# Patient Record
Sex: Female | Born: 1995 | Race: Black or African American | Hispanic: No | Marital: Single | Smoking: Never smoker
Health system: Southern US, Community
[De-identification: ages and names within clinical notes are randomized; demographics above are authoritative.]

## PROBLEM LIST (undated history)

## (undated) ENCOUNTER — Ambulatory Visit: Admission: EM | Payer: PPO | Source: Home / Self Care

## (undated) DIAGNOSIS — D649 Anemia, unspecified: Secondary | ICD-10-CM

## (undated) DIAGNOSIS — E559 Vitamin D deficiency, unspecified: Secondary | ICD-10-CM

## (undated) DIAGNOSIS — F419 Anxiety disorder, unspecified: Secondary | ICD-10-CM

## (undated) HISTORY — DX: Anxiety disorder, unspecified: F41.9

## (undated) HISTORY — PX: OVARIAN CYST REMOVAL: SHX89

## (undated) HISTORY — DX: Anemia, unspecified: D64.9

## (undated) HISTORY — DX: Vitamin D deficiency, unspecified: E55.9

---
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
## 2017-01-18 ENCOUNTER — Emergency Department (HOSPITAL_COMMUNITY)
Admission: EM | Admit: 2017-01-18 | Discharge: 2017-01-18 | Disposition: A | Discharge status: Home / Self Care | Payer: Medicaid Other | Attending: Emergency Medicine | Admitting: Emergency Medicine

## 2017-01-18 ENCOUNTER — Emergency Department (HOSPITAL_COMMUNITY): Payer: Medicaid Other

## 2017-01-18 ENCOUNTER — Encounter (HOSPITAL_COMMUNITY): Payer: Self-pay

## 2017-01-18 DIAGNOSIS — Y999 Unspecified external cause status: Secondary | ICD-10-CM | POA: Diagnosis not present

## 2017-01-18 DIAGNOSIS — S161XXA Strain of muscle, fascia and tendon at neck level, initial encounter: Secondary | ICD-10-CM

## 2017-01-18 DIAGNOSIS — Y92410 Unspecified street and highway as the place of occurrence of the external cause: Secondary | ICD-10-CM | POA: Insufficient documentation

## 2017-01-18 DIAGNOSIS — S199XXA Unspecified injury of neck, initial encounter: Secondary | ICD-10-CM | POA: Diagnosis present

## 2017-01-18 DIAGNOSIS — S20212A Contusion of left front wall of thorax, initial encounter: Secondary | ICD-10-CM | POA: Diagnosis not present

## 2017-01-18 DIAGNOSIS — Y9389 Activity, other specified: Secondary | ICD-10-CM | POA: Insufficient documentation

## 2017-01-18 DIAGNOSIS — Z79899 Other long term (current) drug therapy: Secondary | ICD-10-CM | POA: Diagnosis not present

## 2017-01-18 LAB — CBG MONITORING, ED: GLUCOSE-CAPILLARY: 93 mg/dL (ref 65–99)

## 2017-01-18 MED ORDER — ACETAMINOPHEN 325 MG PO TABS
650.0000 mg | ORAL_TABLET | Freq: Once | ORAL | Status: AC
  Administered 2017-01-18: 650 mg via ORAL
  Filled 2017-01-18: qty 2

## 2017-01-18 MED ORDER — TETRACAINE HCL 0.5 % OP SOLN
2.0000 [drp] | Freq: Once | OPHTHALMIC | Status: AC
  Administered 2017-01-18: 2 [drp] via OPHTHALMIC
  Filled 2017-01-18: qty 4

## 2017-01-18 MED ORDER — FLUORESCEIN SODIUM 0.6 MG OP STRP
1.0000 | ORAL_STRIP | Freq: Once | OPHTHALMIC | Status: AC
  Administered 2017-01-18: 1 via OPHTHALMIC
  Filled 2017-01-18: qty 1
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
## 2017-01-18 NOTE — ED Notes (Signed)
Pt states she understands instructions. Home stable with family.States mpain much improved and speaking and moving more easily.

## 2017-01-18 NOTE — ED Notes (Signed)
Patient transported to CT 
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
## 2019-03-16 ENCOUNTER — Ambulatory Visit: Payer: Self-pay | Admitting: Nurse Practitioner

## 2019-03-20 ENCOUNTER — Other Ambulatory Visit (HOSPITAL_COMMUNITY)
Admission: RE | Admit: 2019-03-20 | Discharge: 2019-03-20 | Disposition: A | Discharge status: Home / Self Care | Payer: Medicaid Other | Source: Ambulatory Visit | Attending: Nurse Practitioner | Admitting: Nurse Practitioner

## 2019-03-20 ENCOUNTER — Ambulatory Visit (INDEPENDENT_AMBULATORY_CARE_PROVIDER_SITE_OTHER): Payer: Medicaid Other | Admitting: Advanced Practice Midwife

## 2019-03-20 ENCOUNTER — Other Ambulatory Visit: Payer: Self-pay

## 2019-03-20 ENCOUNTER — Encounter: Payer: Self-pay | Admitting: Advanced Practice Midwife

## 2019-03-20 VITALS — BP 111/72 | HR 77 | Temp 99.1°F | Ht 65.0 in | Wt 133.7 lb

## 2019-03-20 DIAGNOSIS — L700 Acne vulgaris: Secondary | ICD-10-CM

## 2019-03-20 DIAGNOSIS — N898 Other specified noninflammatory disorders of vagina: Secondary | ICD-10-CM | POA: Insufficient documentation

## 2019-03-20 DIAGNOSIS — N939 Abnormal uterine and vaginal bleeding, unspecified: Secondary | ICD-10-CM | POA: Diagnosis not present

## 2019-03-20 MED ORDER — NORETHIN-ETH ESTRAD-FE BIPHAS 1 MG-10 MCG / 10 MCG PO TABS: 1.0000 | ORAL_TABLET | Freq: Every day | ORAL | 11 refills | Status: DC

## 2019-03-20 NOTE — Progress Notes (Signed)
Subjective:    Robin Nichols is a 23 y.o. female who presents for an annual exam. The patient reports a year of light yellow discharge with odor for previous 43months. The patient is not sexually active. GYN screening history: no prior history of gyn screening tests. Patient denies dysuria, hematuria, or itch. Patient does feel that her menstrual bleeding is "a bit excessive". Endorses that she sometimes bleeds for up to two weeks in a month and she would like to talk about OCP's.   Menstrual History: OB History    Gravida  0   Para  0   Term  0   Preterm  0   AB  0   Living  0     SAB  0   TAB  0   Ectopic  0   Multiple  0   Live Births  0           Menarche age: 89 Patient's last menstrual period was 03/01/2019 (exact date).    The following portions of the patient's history were reviewed and updated as appropriate: current medications, past family history, past medical history, past social history, past surgical history and problem list.  Review of Systems Pertinent items noted in HPI and remainder of comprehensive ROS otherwise negative.    Objective:   BP 111/72   Pulse 77   Temp 99.1 F (37.3 C)   Ht 5\' 5"  (1.651 m)   Wt 133 lb 11.2 oz (60.6 kg)   LMP 03/01/2019 (Exact Date)   BMI 22.25 kg/m  CONSTITUTIONAL: Well-developed, well-nourished female in no acute distress.  HENT:  Normocephalic, atraumatic, External right and left ear normal. Oropharynx is clear and moist EYES: Conjunctivae and EOM are normal. Pupils are equal, round, and reactive to light. No scleral icterus.  NECK: Normal range of motion, supple, no masses.  Normal thyroid.  SKIN: Skin is warm and dry. No rash noted. Not diaphoretic. No erythema. No pallor. [place]: Alert and oriented to person, place, and time. Normal reflexes, muscle tone coordination. No cranial nerve deficit noted. PSYCHIATRIC: Normal mood and affect. Normal behavior. Normal judgment and thought  content. CARDIOVASCULAR: Normal heart rate noted, regular rhythm RESPIRATORY: Clear to auscultation bilaterally. Effort and breath sounds normal, no problems with respiration noted. BREASTS: Symmetric in size. No masses, skin changes, nipple drainage, or lymphadenopathy. ABDOMEN: Soft, normal bowel sounds, no distention noted.  No tenderness, rebound or guarding.  PELVIC: Normal appearing external genitalia; normal appearing vaginal mucosa and cervix.  Normal appearing discharge.  Pap smear obtained.  Normal uterine size, no other palpable masses, no uterine or adnexal tenderness. MUSCULOSKELETAL: Normal range of motion. No tenderness.  No cyanosis, clubbing, or edema.  2+ distal pulses.   Assessment:   The differential diagnosis for a patient with intermittent abnormal vaginal discharge with odor can include bacterial vaginitis, candidal vaginitis and trichomonal vaginitis. The patient noted that she does not have any vaginal itching associated with odorous discharge and denies taking any recent antibiotics which would make candida less likely. Due to the color of discharge it is possible the patient may have a parasitic vaginitis but less likely in someone who is not sexually active.  The management of patients with heavy menstrual bleeding includes combine OCP's. Particularly in this patient without any comorbidities is a valid consideration.    Plan:      KOH prep.   Vance Gather, Medical Student 4:58 PM  I confirm that I have verified the information documented in  the medical student's note and that I have also personally performed the history, physical exam and all medical decision making activities of this service and have verified that all service and findings are accurately documented in this student's note.    A/P:  1. Vaginal discharge --Symptoms x 1 year --Pt denies ever being sexually active, consensual or otherwise.  She declines Pap today and has never had a pelvic  exam.  Discussed reasons for Pap as screening for cervical cancer and reasons for vaginal swab. Pt does consent to vaginal swab today. - Cervicovaginal ancillary only( [place])  2. Vaginal odor  - Cervicovaginal ancillary only( [place])  3. Abnormal uterine bleeding (AUB) --Pt was prescribed OCPs at age 51 for menorrhagia but denies ever taking them.  --Menses regular, monthly, lasting 10-14 days each time --No s/sx of anemia --Start OCPs --Follow up in 3 months - Norethindrone-Ethinyl Estradiol-Fe Biphas (LO LOESTRIN FE) 1 MG-10 MCG / 10 MCG tablet; Take 1 tablet by mouth daily.  Dispense: 1 Package; Refill: 11  4. Acne vulgaris --May improve with OCPs, recommend dermatology if persists.  Sharen Counter, CNM 5:20 PM

## 2019-03-20 NOTE — Progress Notes (Addendum)
New GYN presents for AEX.  C/o malodorous vaginal discharge, pale yellow, clumpy x 1 year. Denies abdominal pain, burning, chills, fever, urinary frequency, NV.  Patient refused PAP smear.   GAD-7=1  Declined Flu vaccine.

## 2019-03-24 ENCOUNTER — Other Ambulatory Visit: Payer: Self-pay | Admitting: Advanced Practice Midwife

## 2019-03-24 DIAGNOSIS — B3731 Acute candidiasis of vulva and vagina: Secondary | ICD-10-CM

## 2019-03-24 DIAGNOSIS — B373 Candidiasis of vulva and vagina: Secondary | ICD-10-CM

## 2019-03-24 LAB — CERVICOVAGINAL ANCILLARY ONLY
Bacterial Vaginitis (gardnerella): NEGATIVE
Candida Glabrata: NEGATIVE
Candida Vaginitis: POSITIVE — AB
Chlamydia: NEGATIVE
Comment: NEGATIVE
Comment: NEGATIVE
Comment: NEGATIVE
Comment: NEGATIVE
Comment: NEGATIVE
Comment: NORMAL
Neisseria Gonorrhea: NEGATIVE
Trichomonas: NEGATIVE

## 2019-03-24 MED ORDER — FLUCONAZOLE 150 MG PO TABS: 150.0000 mg | ORAL_TABLET | Freq: Once | ORAL | 0 refills | Status: AC

## 2019-03-27 ENCOUNTER — Telehealth: Payer: Self-pay

## 2019-03-27 NOTE — Telephone Encounter (Signed)
LVM for patient to call back for lab results 

## 2019-03-27 NOTE — Telephone Encounter (Signed)
-----   Message from Elvera Maria, CNM sent at 03/24/2019  6:09 PM EDT ----- Regarding: pt with yeast infection This 23 yo Gyn pt has yeast infection on vaginal swab this week.  I have sent Diflucan 150 mg x 2 tablets to her pharmacy. She should take one right away, then wait 3 days and take the second one.  She should call us if symptoms persist or return.

## 2019-04-19 NOTE — Telephone Encounter (Signed)
Patient called stating she missed a call from last month regarding lab results. Advised patient that her vaginal culture was positive for yeast and she should have picked up the medication. Patient stated she didn't know about the medication and she was still having symptoms. Advised patient to call the pharmacy to check if they still have the Rx. If not, to call the office back and medication would be sent to pharmacy.  Derl Barrow, RN

## 2019-05-16 ENCOUNTER — Emergency Department (HOSPITAL_COMMUNITY): Payer: Medicaid Other

## 2019-05-16 ENCOUNTER — Other Ambulatory Visit: Payer: Self-pay

## 2019-05-16 ENCOUNTER — Emergency Department (HOSPITAL_COMMUNITY)
Admission: EM | Admit: 2019-05-16 | Discharge: 2019-05-16 | Disposition: A | Discharge status: Home / Self Care | Payer: Medicaid Other | Attending: Emergency Medicine | Admitting: Emergency Medicine

## 2019-05-16 ENCOUNTER — Encounter (HOSPITAL_COMMUNITY): Payer: Self-pay | Admitting: Emergency Medicine

## 2019-05-16 DIAGNOSIS — Z79899 Other long term (current) drug therapy: Secondary | ICD-10-CM | POA: Diagnosis not present

## 2019-05-16 DIAGNOSIS — M79671 Pain in right foot: Secondary | ICD-10-CM

## 2019-05-16 DIAGNOSIS — W19XXXA Unspecified fall, initial encounter: Secondary | ICD-10-CM

## 2019-05-16 DIAGNOSIS — M25561 Pain in right knee: Secondary | ICD-10-CM

## 2019-05-16 MED ORDER — IBUPROFEN 800 MG PO TABS
800.0000 mg | ORAL_TABLET | Freq: Once | ORAL | Status: AC
  Administered 2019-05-16: 800 mg via ORAL
  Filled 2019-05-16: qty 1

## 2019-05-16 MED ORDER — IBUPROFEN 800 MG PO TABS: 800.0000 mg | ORAL_TABLET | Freq: Three times a day (TID) | ORAL | 0 refills | Status: DC

## 2019-05-16 NOTE — ED Provider Notes (Signed)
[facility] EMERGENCY DEPARTMENT Provider Note   CSN: [id number] Arrival date & time: 05/16/19  2054     History   Chief Complaint Chief Complaint  Patient presents with  . Leg Injury    HPI Robin Nichols is a 23 y.o. female.     The history is provided by the patient and medical records.    23 y.o. F with hx of anemia, anxiety, presenting to the ED after a fall.  States she took her dog outside and fell while trying to walk down a hill.  No head injury or LOC.  States she has had ongoing pain in the right knee since she fell, now also having pain in the right foot.  She denies numbness/weakness.  No meds PTA.  Past Medical History:  Diagnosis Date  . Anemia   . Anxiety     There are no active problems to display for this patient.   History reviewed. No pertinent surgical history.   OB History    Gravida  0   Para  0   Term  0   Preterm  0   AB  0   Living  0     SAB  0   TAB  0   Ectopic  0   Multiple  0   Live Births  0            Home Medications    Prior to Admission medications   Medication Sig Start Date End Date Taking? Authorizing Provider  cholecalciferol (VITAMIN D) 1000 UNITS tablet Take 2,000 Units by mouth daily.    [provider]  Norethindrone-Ethinyl Estradiol-Fe Biphas (LO LOESTRIN FE) 1 MG-10 MCG / 10 MCG tablet Take 1 tablet by mouth daily. 03/20/19   Leftwich-Kirby, Wilmer Floor, CNM  norgestimate-ethinyl estradiol (ORTHO-CYCLEN,SPRINTEC,PREVIFEM) 0.25-35 MG-MCG tablet Take 1 tablet by mouth daily. Patient not taking: Reported on 03/20/2019 04/18/13   Lanice Shirts, CNM  spironolactone (ALDACTONE) 50 MG tablet TAKE 1/2 TABLET BY MOUTH EVERY DAY FOR 1 WEEK, THEN TAKE 1 TABLET BY MOUTH EVERY DAY 02/03/19   [provider]    Family History Family History  Problem Relation Age of Onset  . Hypertension Father   . Vision loss Maternal Grandfather   . Depression Brother   . Cancer Paternal Aunt      Social History Social History   Tobacco Use  . Smoking status: Never Smoker  . Smokeless tobacco: Never Used  Substance Use Topics  . Alcohol use: No  . Drug use: No     Allergies   Patient has no known allergies.   Review of Systems Review of Systems  Musculoskeletal: Positive for arthralgias.  All other systems reviewed and are negative.    Physical Exam Updated Vital Signs BP 102/71 (BP Location: Left Arm)   Pulse 91   Temp 98.9 F (37.2 C) (Oral)   Resp 16   Ht 5\' 5"  (1.651 m)   Wt 59.9 kg   LMP 04/26/2019 (Exact Date)   SpO2 100%   BMI 21.97 kg/m   Physical Exam Vitals signs and nursing note reviewed.  Constitutional:      Appearance: She is well-developed.  HENT:     Head: Normocephalic and atraumatic.  Eyes:     Conjunctiva/sclera: Conjunctivae normal.     Pupils: Pupils are equal, round, and reactive to light.  Neck:     Musculoskeletal: Normal range of motion.  Cardiovascular:     Rate  and Rhythm: Normal rate and regular rhythm.     Heart sounds: Normal heart sounds.  Pulmonary:     Effort: Pulmonary effort is normal.     Breath sounds: Normal breath sounds.  Abdominal:     General: Bowel sounds are normal.     Palpations: Abdomen is soft.  Musculoskeletal: Normal range of motion.     Comments: Right knee with tenderness over the patella and along lateral joint line, no gross deformities, able to flex/extend as normal Right foot with tenderness along lateral aspect of dorsal foot, very small area of swelling, no gross deformities; DP pulse intact  Skin:    General: Skin is warm and dry.  Neurological:     Mental Status: She is alert and oriented to person, place, and time.      ED Treatments / Results  Labs (all labs ordered are listed, but only abnormal results are displayed) Labs Reviewed - No data to display  EKG None  Radiology Dg Knee Complete 4 Views Right  Result Date: 05/16/2019 CLINICAL DATA:  Recent fall with knee  pain, initial encounter EXAM: RIGHT KNEE - COMPLETE 4+ VIEW COMPARISON:  None. FINDINGS: No evidence of fracture, dislocation, or joint effusion. No evidence of arthropathy or other focal bone abnormality. Soft tissues are unremarkable. IMPRESSION: No acute abnormality noted. Electronically Signed   By: Inez Catalina M.D.   On: 05/16/2019 23:36   Dg Foot Complete Right  Result Date: 05/16/2019 CLINICAL DATA:  Recent fall with foot pain, initial encounter EXAM: RIGHT FOOT COMPLETE - 3+ VIEW COMPARISON:  None. FINDINGS: There is no evidence of fracture or dislocation. There is no evidence of arthropathy or other focal bone abnormality. Soft tissues are unremarkable. IMPRESSION: No acute abnormality noted. Electronically Signed   By: Inez Catalina M.D.   On: 05/16/2019 23:39    Procedures Procedures (including critical care time)  Medications Ordered in ED Medications - No data to display   Initial Impression / Assessment and Plan / ED Course  I have reviewed the triage vital signs and the nursing notes.  Pertinent labs & imaging results that were available during my care of the patient were reviewed by me and considered in my medical decision making (see chart for details).  23 y.o. F here after a fall down a hill while walking her dog.  No head injury or LOC.  She has pain in the right knee and foot, no gross deformities noted on exam.  Extremity remains NVI.  X-rays negative.  Discussed supportive care with NSAIDs, ice, elevation.  Close follow-up with PCP.  Return here for any new/acute changes.  Final Clinical Impressions(s) / ED Diagnoses   Final diagnoses:  Fall, initial encounter  Acute pain of right knee  Right foot pain    ED Discharge Orders         Ordered    ibuprofen (ADVIL) 800 MG tablet  3 times daily     05/16/19 2343           Larene Pickett, PA-C 05/16/19 2349    Carmin Muskrat, MD 05/18/19 [phone]

## 2019-05-16 NOTE — Discharge Instructions (Signed)
Take the prescribed medication as directed.  Can also use ice to help with any pain/swelling. Follow-up with your primary care doctor. Return to the ED for new or worsening symptoms.

## 2019-05-16 NOTE — ED Triage Notes (Signed)
Pt st's she rolled down a hill and now has pain in right leg

## 2019-05-16 NOTE — ED Notes (Signed)
Patient verbalizes understanding of discharge instructions. Opportunity for questioning and answers were provided. Armband removed by staff, pt discharged from ED.  

## 2020-03-23 ENCOUNTER — Emergency Department (HOSPITAL_COMMUNITY)
Admission: EM | Admit: 2020-03-23 | Discharge: 2020-03-23 | Disposition: A | Discharge status: Home / Self Care | Payer: Medicaid Other | Attending: Emergency Medicine | Admitting: Emergency Medicine

## 2020-03-23 ENCOUNTER — Other Ambulatory Visit: Payer: Self-pay

## 2020-03-23 ENCOUNTER — Encounter (HOSPITAL_COMMUNITY): Payer: Self-pay | Admitting: *Deleted

## 2020-03-23 ENCOUNTER — Emergency Department (HOSPITAL_COMMUNITY): Payer: Medicaid Other

## 2020-03-23 DIAGNOSIS — Y9389 Activity, other specified: Secondary | ICD-10-CM | POA: Insufficient documentation

## 2020-03-23 DIAGNOSIS — Y999 Unspecified external cause status: Secondary | ICD-10-CM | POA: Diagnosis not present

## 2020-03-23 DIAGNOSIS — Y92414 Local residential or business street as the place of occurrence of the external cause: Secondary | ICD-10-CM | POA: Diagnosis not present

## 2020-03-23 DIAGNOSIS — S161XXA Strain of muscle, fascia and tendon at neck level, initial encounter: Secondary | ICD-10-CM | POA: Diagnosis not present

## 2020-03-23 DIAGNOSIS — S199XXA Unspecified injury of neck, initial encounter: Secondary | ICD-10-CM | POA: Diagnosis present

## 2020-03-23 MED ORDER — CYCLOBENZAPRINE HCL 10 MG PO TABS: 10.0000 mg | ORAL_TABLET | Freq: Two times a day (BID) | ORAL | 0 refills | Status: DC | PRN

## 2020-03-23 MED ORDER — IBUPROFEN 800 MG PO TABS
800.0000 mg | ORAL_TABLET | Freq: Once | ORAL | Status: AC
  Administered 2020-03-23: 800 mg via ORAL
  Filled 2020-03-23: qty 1

## 2020-03-23 NOTE — ED Notes (Signed)
Ambulated out of department without difficulty or noted limitations
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
## 2020-03-23 NOTE — Discharge Instructions (Addendum)
Please continue ibuprofen 600 to 800 mg every 6 hours for 2 to 3 days.  You may additionally take Tylenol as needed for pain.  Use gentle movement of extremities and neck for muscle pain.  Also use hot and cold for pain.  Return to the emergency department if you have significant change in your pain, shortness of breath, or other new symptoms.
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
## 2021-05-30 ENCOUNTER — Encounter (HOSPITAL_COMMUNITY): Payer: Self-pay

## 2021-05-30 ENCOUNTER — Emergency Department (HOSPITAL_COMMUNITY): Payer: No Typology Code available for payment source

## 2021-05-30 ENCOUNTER — Emergency Department (HOSPITAL_COMMUNITY)
Admission: EM | Admit: 2021-05-30 | Discharge: 2021-05-30 | Disposition: A | Discharge status: Home / Self Care | Payer: No Typology Code available for payment source | Attending: Emergency Medicine | Admitting: Emergency Medicine

## 2021-05-30 ENCOUNTER — Other Ambulatory Visit: Payer: Self-pay

## 2021-05-30 DIAGNOSIS — Y92410 Unspecified street and highway as the place of occurrence of the external cause: Secondary | ICD-10-CM | POA: Insufficient documentation

## 2021-05-30 DIAGNOSIS — S161XXA Strain of muscle, fascia and tendon at neck level, initial encounter: Secondary | ICD-10-CM | POA: Insufficient documentation

## 2021-05-30 DIAGNOSIS — S39012A Strain of muscle, fascia and tendon of lower back, initial encounter: Secondary | ICD-10-CM | POA: Insufficient documentation

## 2021-05-30 DIAGNOSIS — S199XXA Unspecified injury of neck, initial encounter: Secondary | ICD-10-CM | POA: Diagnosis present

## 2021-05-30 MED ORDER — ACETAMINOPHEN 325 MG PO TABS: 650.0000 mg | ORAL_TABLET | Freq: Four times a day (QID) | ORAL | Status: DC | PRN

## 2021-05-30 MED ORDER — METHOCARBAMOL 500 MG PO TABS: 500.0000 mg | ORAL_TABLET | Freq: Two times a day (BID) | ORAL | 0 refills | Status: DC
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
## 2021-11-11 ENCOUNTER — Ambulatory Visit (INDEPENDENT_AMBULATORY_CARE_PROVIDER_SITE_OTHER): Payer: Medicaid Other | Admitting: Internal Medicine

## 2021-11-11 ENCOUNTER — Encounter: Payer: Self-pay | Admitting: Internal Medicine

## 2021-11-11 VITALS — BP 103/73 | HR 95 | Temp 98.1°F | Ht 64.5 in | Wt 136.4 lb

## 2021-11-11 DIAGNOSIS — M940 Chondrocostal junction syndrome [Tietze]: Secondary | ICD-10-CM | POA: Diagnosis present

## 2021-11-11 DIAGNOSIS — F32A Depression, unspecified: Secondary | ICD-10-CM

## 2021-11-11 DIAGNOSIS — F329 Major depressive disorder, single episode, unspecified: Secondary | ICD-10-CM

## 2021-11-11 DIAGNOSIS — Z299 Encounter for prophylactic measures, unspecified: Secondary | ICD-10-CM | POA: Diagnosis not present

## 2021-11-11 HISTORY — DX: Major depressive disorder, single episode, unspecified: F32.9

## 2021-11-11 HISTORY — DX: Encounter for prophylactic measures, unspecified: Z29.9

## 2021-11-11 MED ORDER — NAPROXEN 500 MG PO TABS: 500.0000 mg | ORAL_TABLET | Freq: Two times a day (BID) | ORAL | 0 refills | Status: AC

## 2021-11-11 MED ORDER — NAPROXEN 500 MG PO TABS: 500.0000 mg | ORAL_TABLET | Freq: Two times a day (BID) | ORAL | 0 refills | Status: DC

## 2021-11-11 MED ORDER — DICLOFENAC SODIUM 1 % EX GEL: 2.0000 g | Freq: Four times a day (QID) | CUTANEOUS | 0 refills | Status: DC

## 2021-11-11 MED ORDER — DICLOFENAC SODIUM 1 % EX GEL: 2.0000 g | Freq: Four times a day (QID) | CUTANEOUS | 0 refills | Status: AC

## 2021-11-11 MED ORDER — FLUOXETINE HCL 20 MG PO CAPS: 20.0000 mg | ORAL_CAPSULE | Freq: Every day | ORAL | 0 refills | Status: DC

## 2021-11-11 NOTE — Patient Instructions (Addendum)
Robin Nichols, it was a pleasure seeing you today! You endorsed feeling well today. Below are some of the things we talked about this visit. We look forward to seeing you in the follow up appointment!  Today we discussed: It was a pleasure meeting you today! You presented today for visit to establish care.  You stated you had some right rib pain that is worse with laughing, coughing and exerting yourself.  I would advise you to continue taking NSAIDs, Tylenol, using heat therapy, and using lidocaine cream for relief.  This appears to be caused by heavy lifting so I would advise you to stop heavy lifting.  If this does not get better in a few weeks please make an appointment. You also depressed and have a history of depression.  One of the scoring tools that we use was consistent with significant depression.  You are not taking any medication right now so we will start a medication called SSRI to improve your mood.  This medicine can take some time to work so please give it at least 1.5 months to take effect.  If you get any side effects from this medication please let us know and we can change to a different medication.  Do not stop taking the medication without pain is noted.  We have also given you information counseling service.  Please contact to schedule with them as this will help significantly.  We will call to see you again in the clinic!  I have ordered the following labs today:  Lab Orders  No laboratory test(s) ordered today      Referrals ordered today:   Referral Orders  No referral(s) requested today     I have ordered the following medication/changed the following medications:   Stop the following medications: Medications Discontinued During This Encounter  Medication Reason   cyclobenzaprine (FLEXERIL) 10 MG tablet Completed Course   ibuprofen (ADVIL) 800 MG tablet Completed Course   methocarbamol (ROBAXIN) 500 MG tablet Completed Course   spironolactone (ALDACTONE) 50 MG  tablet Completed Course   Norethindrone-Ethinyl Estradiol-Fe Biphas (LO LOESTRIN FE) 1 MG-10 MCG / 10 MCG tablet Patient has not taken in last 30 days   norgestimate-ethinyl estradiol (ORTHO-CYCLEN,SPRINTEC,PREVIFEM) 0.25-35 MG-MCG tablet Patient has not taken in last 30 days   naproxen (NAPROSYN) 500 MG tablet    diclofenac Sodium (VOLTAREN) 1 % GEL      Start the following medications: Meds ordered this encounter  Medications   DISCONTD: naproxen (NAPROSYN) 500 MG tablet    Sig: Take 1 tablet (500 mg total) by mouth 2 (two) times daily with a meal for 14 days.    Dispense:  28 tablet    Refill:  0   DISCONTD: diclofenac Sodium (VOLTAREN) 1 % GEL    Sig: Apply 2 g topically 4 (four) times daily for 14 days.    Dispense:  100 g    Refill:  0   FLUoxetine (PROZAC) 20 MG capsule    Sig: Take 1 capsule (20 mg total) by mouth daily.    Dispense:  60 capsule    Refill:  0   diclofenac Sodium (VOLTAREN) 1 % GEL    Sig: Apply 2 g topically 4 (four) times daily for 14 days.    Dispense:  100 g    Refill:  0   naproxen (NAPROSYN) 500 MG tablet    Sig: Take 1 tablet (500 mg total) by mouth 2 (two) times daily with a meal for 14 days.  Dispense:  28 tablet    Refill:  0     Follow-up: 1 months  Please make sure to arrive 15 minutes prior to your next appointment. If you arrive late, you may be asked to reschedule.   We look forward to seeing you next time. Please call our clinic at [phone] if you have any questions or concerns. The best time to call is Monday-Friday from 9am-4pm, but there is someone available 24/7. If after hours or the weekend, call the main hospital number and ask for the Internal Medicine Resident On-Call. If you need medication refills, please notify your pharmacy one week in advance and they will send [name] a request.  Thank you for letting us take part in your care. Wishing you the best!  Thank you, Gwenevere Abbot, MD

## 2021-11-11 NOTE — Assessment & Plan Note (Signed)
Patient has costochondritis 2/2 to heavy lifting that has responded to conservative therapy. Patient has limited the  conservative therapy so will advise her to continue conservative therapy. Her pain is worsened with exertion, coughing and laughing.  -Naproxen 500 mg BID for 14 days -Voltaren gel topical application QID prn -Thermal therapy -Lidocaine cream for symptomatic relief

## 2021-11-11 NOTE — Progress Notes (Signed)
  CC: Establish Care  HPI: Robin Nichols is a 26 y.o. female with a past medical history stated below and presents today for to establish care. Please see problem based assessment and plan for additional details.  PMHx: Patient reported no past medical hx but chart review showed PTSD, and Depression  Medications: Ibuprofen prn Tylenol 650 mg prn No prescription medications  Past Surgical Hx: Right ovarian cyst removal 04/2021  Family History  Problem Relation Age of Onset   Hypertension Father    Vision loss Maternal Grandfather    Depression Brother    Cancer Paternal Aunt   Kidney failure in mom s/p kidney transplant Paternal aunt with breast cancer in 27s  Social History:  Lives with parents. Goes to [employer] and finished first year in physiology. No pets. Lives with sister at home. No cigarettes. Smoked for 2 years before stopping in last February. Drink once every three months. Sexually active. Not on contraceptives.   Review of Systems: RUQ pain with deep breathing, laughing and exertion. ROS negative except for what is noted on the assessment and plan.  Vitals:   11/11/21 1525  BP: 103/73  Pulse: 95  Temp: 98.1 F (36.7 C)  TempSrc: Oral  SpO2: 100%  Weight: 136 lb 6.4 oz (61.9 kg)  Height: 5' 4.5" (1.638 m)   Physical Exam: General: Well appearing young woman, NAD HENT: normocephalic, atraumatic EYES: conjunctiva non-erythematous, no scleral icterus CV: regular rate, normal rhythm, no murmurs, rubs, gallops. Pulmonary: normal work of breathing on RA, lungs clear to auscultation, no rales, wheezes, rhonchi Abdominal: non-distended, soft, non-tender to palpation, normal BS Skin: Warm and dry, no rashes or lesions Neurological: MS: awake, alert and oriented x3, normal speech and fund of knowledge Motor: moves all extremities antigravity. Pain improves with palpation of right rib area.  Psych: normal affect  Assessment & Plan:   See Encounters  Tab for problem based charting.  Patient discussed with Dr. Azzie Almas, M.D. [facility], PGY-1 Pager: [phone]
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
## 2021-11-19 NOTE — Progress Notes (Signed)
Internal Medicine Clinic Attending  Case discussed with Dr. Khan  At the time of the visit.  We reviewed the resident's history and exam and pertinent patient test results.  I agree with the assessment, diagnosis, and plan of care documented in the resident's note.  

## 2022-01-16 ENCOUNTER — Ambulatory Visit (HOSPITAL_COMMUNITY)
Admission: EM | Admit: 2022-01-16 | Discharge: 2022-01-16 | Disposition: A | Discharge status: Home / Self Care | Payer: Medicaid Other | Attending: Psychiatry | Admitting: Psychiatry

## 2022-01-16 DIAGNOSIS — R45851 Suicidal ideations: Secondary | ICD-10-CM | POA: Insufficient documentation

## 2022-01-16 DIAGNOSIS — F32A Depression, unspecified: Secondary | ICD-10-CM | POA: Insufficient documentation

## 2022-01-16 NOTE — Discharge Instructions (Addendum)
In case of an urgent crisis, you may contact the Mobile Crisis Unit with [facility] at [phone].        Outpatient Services for Therapy and Medication Management for [facility]  [facility] [address], [place], [address] [phone] phone   New Patient Assessment/Therapy Walk-Ins:  Monday and Wednesday: 8 am until slots are full. Every 1st and 2nd Fridays of the month: 1 pm - 5 pm.  NO ASSESSMENT/THERAPY WALK-INS ON TUESDAYS OR THURSDAYS  New Patient Assessment/Medication Management Walk-Ins:  Monday - Friday:  8 am - 11 am.  For all walk-ins, we ask that you arrive by 7:00 am because patients will be seen in the order of arrival.  Availability is limited; therefore, you may not be seen on the same day that you walk-in.  Our goal is to serve and meet the needs of our community to the best of our ability.    [facility] [address] [place], [place], [address] [phone] phone  [facility] [address], [address] [place], [place], [address] [phone] phone  [facility] [address], [address] [place], [place], [address] [phone] phone  [facility] [place] [address], [place], [address] [phone] phone  [facility], [place] [address], [place], [address] [phone] phone  [facility] [address] [place], [place], [address] [phone] phone [phone] fax  [facility] [address] [place], [place], [address] [phone] phone [phone] fax  [facility] [phone] [address] [place], [place], [address] [phone] phone  [facility]ov[facility] Kussmaul [address] [place], [place], [address]  [phone] phone  [facility] [address] [place], [place], [address]  [phone] phone [phone] fax
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
## 2022-01-16 NOTE — BH Assessment (Signed)
LCSW Progress Note   Per Kelle Darting, NP, this pt does not require psychiatric hospitalization at this time.  Pt is psychiatrically cleared.  Discharge instructions include several resources for outpatient therapy and medication management along with the mobile crisis number.  EDP Kelle Darting, NP, has been notified.  Hansel Starling, MSW, LCSW [facility] [phone] or [phone]

## 2022-01-16 NOTE — BH Assessment (Signed)
Patient is a 26 year old female that presents this date voluntary requesting information in reference to receiving services for ongoing Depression and Anxiety. Patient denies any S/I, H/I or AVH. 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 At that time patient had initially been hospitalized at [facility] for 2 weeks after a number of "high risks behaviors." Patient is vague in reference to the events that occurred prior to that admission although reported she had passive S/I at that time denying any actual event. Patient states she was placed on numerous SSRI's although cannot recall what medications she was prescribed. Patient states she discontinued those medications as symptoms self resolved later in 2021. patient has not had any services since then although reports that the stress of grad school has made her feel in the last 6 months, hopeless and irritable. Patient reports poor sleep hygiene stating she only gets 4 hours "at the most" and often stays up late for no reason. Patient denies any current SA issues although reports sporadic THC use with last use over a month ago.

## 2022-01-19 ENCOUNTER — Ambulatory Visit (INDEPENDENT_AMBULATORY_CARE_PROVIDER_SITE_OTHER): Payer: Medicaid Other | Admitting: Psychiatry

## 2022-01-19 ENCOUNTER — Encounter (HOSPITAL_COMMUNITY): Payer: Self-pay | Admitting: Psychiatry

## 2022-01-19 DIAGNOSIS — F900 Attention-deficit hyperactivity disorder, predominantly inattentive type: Secondary | ICD-10-CM | POA: Diagnosis not present

## 2022-01-19 DIAGNOSIS — F411 Generalized anxiety disorder: Secondary | ICD-10-CM

## 2022-01-19 DIAGNOSIS — F4310 Post-traumatic stress disorder, unspecified: Secondary | ICD-10-CM

## 2022-01-19 DIAGNOSIS — F333 Major depressive disorder, recurrent, severe with psychotic symptoms: Secondary | ICD-10-CM

## 2022-01-19 MED ORDER — HYDROXYZINE HCL 10 MG PO TABS: 10.0000 mg | ORAL_TABLET | Freq: Three times a day (TID) | ORAL | 3 refills | Status: DC | PRN

## 2022-01-19 MED ORDER — ATOMOXETINE HCL 40 MG PO CAPS: 40.0000 mg | ORAL_CAPSULE | Freq: Every day | ORAL | 3 refills | Status: DC

## 2022-01-19 MED ORDER — QUETIAPINE FUMARATE 25 MG PO TABS: 25.0000 mg | ORAL_TABLET | Freq: Every day | ORAL | 3 refills | Status: DC
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
## 2022-01-20 ENCOUNTER — Encounter (HOSPITAL_COMMUNITY): Payer: Self-pay

## 2022-01-20 ENCOUNTER — Ambulatory Visit (HOSPITAL_COMMUNITY): Payer: PPO | Admitting: Mental Health

## 2022-01-27 ENCOUNTER — Telehealth (HOSPITAL_COMMUNITY): Payer: Self-pay | Admitting: Internal Medicine

## 2022-01-27 NOTE — BH Assessment (Signed)
Care Management - [facility] Follow Up Discharges   Writer attempted to make contact with patient today and was unsuccessful.  Writer left a HIPPA compliant voice message.   Per chart review, patient completed an appointment with Toy Cookey, NP on 01-19-2022.

## 2022-04-13 ENCOUNTER — Telehealth (HOSPITAL_COMMUNITY): Payer: PPO | Admitting: Psychiatry

## 2022-05-19 ENCOUNTER — Telehealth (HOSPITAL_COMMUNITY): Payer: Self-pay | Admitting: *Deleted

## 2022-05-19 NOTE — Telephone Encounter (Signed)
Psychiatry Team  @ [facility] -- called to psych history on patient return call  [phone]

## 2022-05-19 NOTE — Telephone Encounter (Signed)
Psychiatry Team  @ [facility] -- called to psych history on patient return call  [phone] 

## 2022-05-21 NOTE — Telephone Encounter (Signed)
Patient consented to to having information shared with Dr. Lee at [facility]. Provider informed Dr. Lee that the patient was only seen at the outpatient clinic once. Provider discussed patients last visit with Dr. Lee.  Dr. Lee was informed that notes could be faxed at her request. No other concerns noted at this time.

## 2022-05-21 NOTE — Telephone Encounter (Signed)
Patient consented to to having information shared with Dr. Nedra Hai at [facility]. Provider informed Dr. Nedra Hai that the patient was only seen at the outpatient clinic once. Provider discussed patients last visit with Dr. Nedra Hai.  Dr. Nedra Hai was informed that notes could be faxed at her request. No other concerns noted at this time.

## 2022-06-10 ENCOUNTER — Encounter (HOSPITAL_COMMUNITY): Payer: Self-pay | Admitting: Psychiatry

## 2022-06-10 ENCOUNTER — Telehealth (INDEPENDENT_AMBULATORY_CARE_PROVIDER_SITE_OTHER): Payer: PPO | Admitting: Psychiatry

## 2022-06-10 DIAGNOSIS — F332 Major depressive disorder, recurrent severe without psychotic features: Secondary | ICD-10-CM | POA: Diagnosis not present

## 2022-06-10 DIAGNOSIS — F411 Generalized anxiety disorder: Secondary | ICD-10-CM

## 2022-06-10 DIAGNOSIS — F900 Attention-deficit hyperactivity disorder, predominantly inattentive type: Secondary | ICD-10-CM

## 2022-06-10 MED ORDER — ATOMOXETINE HCL 40 MG PO CAPS: 40.0000 mg | ORAL_CAPSULE | Freq: Every day | ORAL | 3 refills | Status: DC

## 2022-06-10 MED ORDER — ESCITALOPRAM OXALATE 10 MG PO TABS: 10.0000 mg | ORAL_TABLET | Freq: Every day | ORAL | 3 refills | Status: DC

## 2022-06-10 MED ORDER — HYDROXYZINE HCL 25 MG PO TABS: 25.0000 mg | ORAL_TABLET | Freq: Three times a day (TID) | ORAL | 3 refills | Status: DC | PRN

## 2022-06-10 NOTE — Progress Notes (Signed)
BH MD/PA/NP OP Progress Note  06/10/2022 11:46 AM Robin Nichols  MRN:  [id number]  Chief Complaint: "I have been feeling low"  HPI: 27 year old female seen today for follow up psychiatric evaluation. She has a psychiatric history of Depression, ADHD, Anxiety. Patient reports that she was recently admitted to to [facility] for increasing depression and SI. She was started on Lexapro 10 mg nightly. She reports Seroquel and hydroxyzine was discontinued. She continues to take Strattera 40 mg daily. She reports her medications are somewhat effective in managing her psychiatric conditions.  Today patient was unable to logon virtually so assessment done over the phone.  During exam she was pleasant, cooperative, and engaged in conversation.  She informed Probation officer that she has been feeling low and anxious.  She notes that recently she was discharged from [facility] in December.  She informed Probation officer that she is nervous about her schooling and how people perceive her. She notes that she is obtaining her Master degree in Physiology at [employer].  Today provider conducted a GAD-7 and patient scored an 18.  Provider also conducted PHQ-9 and patient scored a 20.  She notes that her sleep is poor at times.  Hospitalized patient notes that she was given hydroxyzine and reports that it was effective in managing her sleep.  She however reports that it was not prescribed for her after discharge.  At times patient notes that she is irritable, distractible, and having racing thoughts.  She however denies other symptoms of mania.  She notes that her appetite is poor and reports that she is lost over 15 pounds.  Patient informed Probation officer that she ran out of Strattera however notes that it was effective in helping manage her ADHD.  Today patient is agreeable to restarting Strattera 40 mg to help manage symptoms of ADHD.  She is also agreeable to increasing hydroxyzine 10 mg 3 times daily to 25 mg  daily to help manage anxiety and sleep.  At this time Lexapro not increased however can be increased at her next visit.  Patient will follow-up with outpatient counseling for therapy.  No other concerns noted at this time. Visit Diagnosis:    ICD-10-CM   1. Severe episode of recurrent major depressive disorder, without psychotic features ([place])  F33.2 escitalopram (LEXAPRO) 10 MG tablet    2. Generalized anxiety disorder  F41.1 hydrOXYzine (ATARAX) 25 MG tablet    escitalopram (LEXAPRO) 10 MG tablet    3. Attention deficit hyperactivity disorder (ADHD), predominantly inattentive type  F90.0 atomoxetine (STRATTERA) 40 MG capsule      Past Psychiatric History: Anxiety, depression, ADHD, and anxiety  Past Medical History:  Past Medical History:  Diagnosis Date   Anemia    Anxiety    MDD (major depressive disorder) 11/11/2021    Past Surgical History:  Procedure Laterality Date   OVARIAN CYST REMOVAL      Family Psychiatric History: Maternal grandfather dementia, brother with depression  Family History:  Family History  Problem Relation Age of Onset   Hypertension Father    Vision loss Maternal Grandfather    Depression Brother    Cancer Paternal Aunt     Social History:  Social History   Socioeconomic History   Marital status: Single    Spouse name: Not on file   Number of children: 0   Years of education: Not on file   Highest education level: Not on file  Occupational History   Occupation: [place]  Use   Smoking status: Never   Smokeless tobacco: Never  Vaping Use   Vaping Use: Never used  Substance and Sexual Activity   Alcohol use: No   Drug use: No   Sexual activity: Never  Other Topics Concern   Not on file  Social History Narrative   Not on file   Social Determinants of Health   Financial Resource Strain: Not on file  Food Insecurity: Not on file  Transportation Needs: Not on file  Physical Activity: Not on file  Stress: Not on file   Social Connections: Not on file    Allergies: No Known Allergies  Metabolic Disorder Labs: No results found for: "HGBA1C", "MPG" No results found for: "PROLACTIN" No results found for: "CHOL", "TRIG", "HDL", "CHOLHDL", "VLDL", "LDLCALC" No results found for: "TSH"  Therapeutic Level Labs: No results found for: "LITHIUM" No results found for: "VALPROATE" No results found for: "CBMZ"  Current Medications: Current Outpatient Medications  Medication Sig Dispense Refill   escitalopram (LEXAPRO) 10 MG tablet Take 1 tablet (10 mg total) by mouth daily. 30 tablet 3   atomoxetine (STRATTERA) 40 MG capsule Take 1 capsule (40 mg total) by mouth daily. 30 capsule 3   cholecalciferol (VITAMIN D) 1000 UNITS tablet Take 2,000 Units by mouth daily.     hydrOXYzine (ATARAX) 25 MG tablet Take 1 tablet (25 mg total) by mouth 3 (three) times daily as needed. 90 tablet 3   No current facility-administered medications for this visit.     Musculoskeletal: Strength & Muscle Tone:  Unable to assess due to telephone visit Gait & Station:  Unable to assess due to telephone visit Patient leans: N/A  Psychiatric Specialty Exam: Review of Systems  There were no vitals taken for this visit.There is no height or weight on file to calculate BMI.  General Appearance:  Unable to assess due to telephone visit  Eye Contact:   Unable to assess due to telephone visit  Speech:  Clear and Coherent and Slow  Volume:  Decreased  Mood:  Anxious and Depressed  Affect:  Appropriate and Congruent  Thought Process:  Coherent, Goal Directed, and Linear  Orientation:  Full (Time, Place, and Person)  Thought Content: WDL and Logical   Suicidal Thoughts:  No  Homicidal Thoughts:  No  Memory:  Immediate;   Good Recent;   Good Remote;   Good  Judgement:  Good  Insight:  Good  Psychomotor Activity:   Unable to assess due to telephone visit  Concentration:  Concentration: Fair and Attention Span: Fair  Recall:  Good   Fund of Knowledge: Good  Language: Good  Akathisia:   Unable to assess due to telephone visit  Handed:   Right   AIMS (if indicated): not done  Assets:  Communication Skills Desire for Improvement Financial Resources/Insurance Housing Physical Health Social Support Talents/Skills Vocational/Educational  ADL's:  Intact  Cognition: WNL  Sleep:  Fair   Screenings: GAD-7    Flowsheet Row Video Visit from 06/10/2022 in [facility] Office Visit from 01/19/2022 in [facility] Office Visit from 03/20/2019 in [facility]  Total GAD-7 Score 18 19 1       PHQ2-9    Flowsheet Row Video Visit from 06/10/2022 in [facility] Office Visit from 01/19/2022 in [facility] Office Visit from 11/11/2021 in [facility]  PHQ-2 Total Score 6 5 4   PHQ-9 Total  Score 20 20 19       Flowsheet Row Office Visit from 01/19/2022 in [facility] ED from 05/30/2021 in [place] [facility]  C-SSRS RISK CATEGORY Error: Q7 should not be populated when Q6 is No No Risk        Assessment and Plan: Patient endorses increased anxiety and depression.  Today she is agreeable to increasing hydroxyzine 10 mg 3 times daily to 25 mg 3 times daily as needed to help manage sleep and anxiety.  She will restart Strattera and continue Lexapro as prescribed.  1. Generalized anxiety disorder  Restart/Increase- hydrOXYzine (ATARAX) 25 MG tablet; Take 1 tablet (25 mg total) by mouth 3 (three) times daily as needed.  Dispense: 90 tablet; Refill: 3  2. Attention deficit hyperactivity disorder (ADHD), predominantly inattentive type  Restart- atomoxetine (STRATTERA) 40 MG capsule; Take 1 capsule (40 mg total) by mouth daily.  Dispense: 30 capsule; Refill: 3  3. Severe episode of recurrent major depressive disorder,  without psychotic features ([place])  Continue- escitalopram (LEXAPRO) 10 MG tablet; Take 1 tablet (10 mg total) by mouth daily.  Dispense: 30 tablet; Refill: 3   Collaboration of Care: Collaboration of Care: Other provider involved in patient's care AEB counselor  Patient/Guardian was advised Release of Information must be obtained prior to any record release in order to collaborate their care with an outside provider. Patient/Guardian was advised if they have not already done so to contact the registration department to sign all necessary forms in order for [name] to release information regarding their care.   Consent: Patient/Guardian gives verbal consent for treatment and assignment of benefits for services provided during this visit. Patient/Guardian expressed understanding and agreed to proceed.   Follow-up in 3 months Follow-up with therapy  Salley Slaughter, NP 06/10/2022, 11:46 AM

## 2022-06-25 ENCOUNTER — Ambulatory Visit (HOSPITAL_COMMUNITY): Payer: PPO | Admitting: Clinical

## 2022-07-16 ENCOUNTER — Ambulatory Visit (INDEPENDENT_AMBULATORY_CARE_PROVIDER_SITE_OTHER): Payer: Medicaid Other | Admitting: Clinical

## 2022-07-16 DIAGNOSIS — F332 Major depressive disorder, recurrent severe without psychotic features: Secondary | ICD-10-CM | POA: Diagnosis not present

## 2022-07-16 DIAGNOSIS — F411 Generalized anxiety disorder: Secondary | ICD-10-CM

## 2022-07-16 DIAGNOSIS — F900 Attention-deficit hyperactivity disorder, predominantly inattentive type: Secondary | ICD-10-CM

## 2022-07-17 NOTE — Progress Notes (Signed)
Comprehensive Clinical Assessment (CCA) Note  07/16/2022 Robin Nichols [id number]  Virtual Visit via Video Note  I connected with Robin Nichols on 07/16/2022 at  2:00 PM EST by a video enabled telemedicine application and verified that I am speaking with the correct person using two identifiers.  Location: Patient: school Provider: office   I discussed the limitations of evaluation and management by telemedicine and the availability of in person appointments. The patient expressed understanding and agreed to proceed.   Follow Up Instructions: I discussed the assessment and treatment plan with the patient. The patient was provided an opportunity to ask questions and all were answered. The patient agreed with the plan and demonstrated an understanding of the instructions.   The patient was advised to call back or seek an in-person evaluation if the symptoms worsen or if the condition fails to improve as anticipated.  I provided 45 minutes of non-face-to-face time during this encounter.   Bernestine Amass, LCSW   Chief Complaint:  Chief Complaint  Patient presents with   ADHD   Anxiety   Depression   Visit Diagnosis:   Severe episode of recurrent major depressive disorder, without psychotic features ADHD Generalized anxiety disorder   Interpretive Summary:  Client is a 27 year old female presenting to the [facility]. Client is currently being followed by a [facility] psychiatrist for the treatment of major depression, generalized anxiety and ADHD. Client reported she is medication compliant but her symptoms remain consistent. Client reported depressed mood, feelings of hopelessness, shakiness, loathing, and worry. Client reported history of suicidal ideations and hospitalization in December 2023 for depression. Client stated "my heart is heavy all the time and I want it to stop doing that". Client reported her protective factors are her mother who is positive  support and her religious beliefs. Client denied illicit substance use. Client presented oriented times five, appropriately dressed, and friendly. Client denied hallucinations, delusions, suicidal and homicidal ideations. Client was screened for pain, nutrition, [place] suicide severity and the following SDOH:     06/10/2022   11:09 AM 01/19/2022    8:38 AM 03/20/2019    4:06 PM  GAD 7 : Generalized Anxiety Score  Nervous, Anxious, on Edge 2 3 0  Control/stop worrying 3 3 0  Worry too much - different things 2 3 1  $ Trouble relaxing 3 2 0  Restless 2 2 0  Easily annoyed or irritable 3 3 0  Afraid - awful might happen 3 3 0  Total GAD 7 Score 18 19 1  $ Anxiety Difficulty Very difficult Very difficult Not difficult at all     Flowsheet Row Video Visit from 06/10/2022 in [facility]  PHQ-9 Total Score 20        Treatment Recommendations: therapy and psychiatry   CCA Biopsychosocial Intake/Chief Complaint:  Client reported she is presenting by self referral and your current [facility] psyschiatrist for outpatient therapy. Client is currently being followed by her [facility] psychiatrist for the treatment of major depression, ADHD, and generalized anxiety disorder. Client reported her symptoms have been ongoing since 27 years old.  Current Symptoms/Problems: Client reported uncontrollable self loathing, hopelessness, low self esteem, excessive worry, fear of failure, judging her actions as they are happening, difficulty starting things if there is no addrelailine or passion behind it, hard to finish things, forgetful   Patient Reported Schizophrenia/Schizoaffective Diagnosis in Past: No data recorded  Strengths: voluntarily seeking services  Preferences: counseling and medication  Abilities: vocalize  problems and needs   Type of Services Patient Feels are Needed: psychiatry and therapy   Initial Clinical Notes/Concerns: No data recorded  Mental Health  Symptoms Depression:   Change in energy/activity; Hopelessness; Worthlessness; Difficulty Concentrating   Duration of Depressive symptoms:  Greater than two weeks   Mania:   None   Anxiety:    Tension; Worrying; Difficulty concentrating   Psychosis:   None   Duration of Psychotic symptoms: No data recorded  Trauma:   None   Obsessions:   None   Compulsions:   None   Inattention:   Disorganized; Forgetful; Symptoms before age 30; Does not follow instructions (not oppositional); Poor follow-through on tasks; Symptoms present in 2 or more settings   Hyperactivity/Impulsivity:   None   Oppositional/Defiant Behaviors:   None   Emotional Irregularity:   None   Other Mood/Personality Symptoms:  No data recorded   Mental Status Exam Appearance and self-care  Stature:   Average   Weight:   Average weight   Clothing:   Casual   Grooming:   Normal   Cosmetic use:   Age appropriate   Posture/gait:   Normal   Motor activity:   Not Remarkable   Sensorium  Attention:   Normal   Concentration:   Normal   Orientation:   X5   Recall/memory:   Normal   Affect and Mood  Affect:   Congruent   Mood:   Depressed   Relating  Eye contact:   None   Facial expression:   Responsive; Depressed   Attitude toward examiner:   Cooperative   Thought and Language  Speech flow:  Clear and Coherent   Thought content:   Appropriate to Mood and Circumstances   Preoccupation:   None   Hallucinations:   None   Organization:  No data recorded  [employer] of Knowledge:   Good   Intelligence:   Average   Abstraction:   Normal   Judgement:   Good   Reality Testing:   Adequate   Insight:   Good   Decision Making:   Normal   Social Functioning  Social Maturity:   Responsible   Social Judgement:   Normal   Stress  Stressors:   Family conflict; School   Coping Ability:   Resilient   Skill Deficits:    Activities of daily living; Self-care; Self-control   Supports:   Family     Religion: Religion/Spirituality Are You A Religious Person?: Yes What is Your Religious Affiliation?: Muslim  Leisure/Recreation: Leisure / Recreation Do You Have Hobbies?: Yes Leisure and Hobbies: jounraling  Exercise/Diet: Exercise/Diet Do You Exercise?: No Have You Gained or Lost A Significant Amount of Weight in the Past Six Months?: No Do You Follow a Special Diet?: No Do You Have Any Trouble Sleeping?: Yes   CCA Employment/Education Employment/Work Situation: Employment / Work Situation Employment Situation: Ship broker  Education: Education Is Patient Currently Attending School?: Yes School Currently Attending: [employer] Did Teacher, adult education From [employer]?: Yes Did You Nutritional therapist?: Yes Did Heritage manager?: Yes What is Your Press photographer?: Biophysics   CCA Family/Childhood History Family and Relationship History: Family history Marital status: Single Does patient have children?: No  Childhood History:  Childhood History Additional childhood history information: Client reported she was born in [place], raised by her mother. client reported her dad was working Company secretary. Client reported she remembers feeling happy and liking herself more. Patient's description of current  relationship with people who raised him/her: Client reported a good relationship with her mom as she is understanding. Client reported a distant relationship with her dad. Client reported her father is "testy" and often acccuses her of lying and doing htigns that she is not doing. Does patient have siblings?: Yes Number of Siblings: 2 Description of patient's current relationship with siblings: Client reported a younger brother and sister. Did patient suffer any verbal/emotional/physical/sexual abuse as a child?: No Did patient suffer from severe childhood neglect?: No Has patient ever been  sexually abused/assaulted/raped as an adolescent or adult?: No Was the patient ever a victim of a crime or a disaster?: No Witnessed domestic violence?: No Has patient been affected by domestic violence as an adult?: No  Child/Adolescent Assessment:     CCA Substance Use Alcohol/Drug Use: Alcohol / Drug Use History of alcohol / drug use?: No history of alcohol / drug abuse                         ASAM's:  Six Dimensions of Multidimensional Assessment  Dimension 1:  Acute Intoxication and/or Withdrawal Potential:      Dimension 2:  Biomedical Conditions and Complications:      Dimension 3:  Emotional, Behavioral, or Cognitive Conditions and Complications:     Dimension 4:  Readiness to Change:     Dimension 5:  Relapse, Continued use, or Continued Problem Potential:     Dimension 6:  Recovery/Living Environment:     ASAM Severity Score:    ASAM Recommended Level of Treatment:     Substance use Disorder (SUD)    Recommendations for Services/Supports/Treatments: Recommendations for Services/Supports/Treatments Recommendations For Services/Supports/Treatments: Medication Management, Individual Therapy  DSM5 Diagnoses: Patient Active Problem List   Diagnosis Date Noted   Attention deficit hyperactivity disorder (ADHD), predominantly inattentive type 01/19/2022   Severe episode of recurrent major depressive disorder, with psychotic features ([place]) 01/19/2022   Generalized anxiety disorder 01/19/2022   MDD (major depressive disorder) 11/11/2021   Acute costochondritis 11/11/2021   Preventive measure 11/11/2021    Patient Centered Plan: Patient is on the following Treatment Plan(s):  Depression   Referrals to Alternative Service(s): Referred to Alternative Service(s):   Place:   Date:   Time:    Referred to Alternative Service(s):   Place:   Date:   Time:    Referred to Alternative Service(s):   Place:   Date:   Time:    Referred to Alternative Service(s):    Place:   Date:   Time:      Collaboration of Care: Referral or follow-up with counselor/therapist AEB [facility]  Patient/Guardian was advised Release of Information must be obtained prior to any record release in order to collaborate their care with an outside provider. Patient/Guardian was advised if they have not already done so to contact the registration department to sign all necessary forms in order for [name] to release information regarding their care.   Consent: Patient/Guardian gives verbal consent for treatment and assignment of benefits for services provided during this visit. Patient/Guardian expressed understanding and agreed to proceed.   [place], LCSW

## 2022-08-14 ENCOUNTER — Ambulatory Visit (HOSPITAL_COMMUNITY): Payer: PPO | Admitting: Clinical

## 2022-08-14 ENCOUNTER — Telehealth (HOSPITAL_COMMUNITY): Payer: Self-pay | Admitting: Clinical

## 2022-08-14 ENCOUNTER — Encounter (HOSPITAL_COMMUNITY): Payer: Self-pay

## 2022-08-14 NOTE — Telephone Encounter (Signed)
Therapist sent the client a link for the scheduled video appt. Client did not respond using the link.

## 2022-08-17 ENCOUNTER — Emergency Department (HOSPITAL_COMMUNITY): Payer: PPO

## 2022-08-17 ENCOUNTER — Emergency Department (HOSPITAL_COMMUNITY)
Admission: EM | Admit: 2022-08-17 | Discharge: 2022-08-17 | Disposition: A | Discharge status: Home / Self Care | Payer: PPO | Attending: Emergency Medicine | Admitting: Emergency Medicine

## 2022-08-17 ENCOUNTER — Encounter (HOSPITAL_COMMUNITY): Payer: Self-pay

## 2022-08-17 ENCOUNTER — Other Ambulatory Visit: Payer: Self-pay

## 2022-08-17 DIAGNOSIS — S0033XA Contusion of nose, initial encounter: Secondary | ICD-10-CM | POA: Insufficient documentation

## 2022-08-17 DIAGNOSIS — S0992XA Unspecified injury of nose, initial encounter: Secondary | ICD-10-CM | POA: Diagnosis present

## 2022-08-17 DIAGNOSIS — W2202XA Walked into lamppost, initial encounter: Secondary | ICD-10-CM | POA: Insufficient documentation

## 2022-08-17 MED ORDER — ACETAMINOPHEN 500 MG PO TABS
1000.0000 mg | ORAL_TABLET | Freq: Once | ORAL | Status: AC
Start: 2022-08-17 — End: 2022-08-17
  Administered 2022-08-17: 1000 mg via ORAL
  Filled 2022-08-17: qty 2

## 2022-08-17 NOTE — Discharge Instructions (Addendum)
It was our pleasure to provide your ER care today - we hope that you feel better.  Your xray looks good - no fracture is seen.  Coldpack to sore area. Take acetaminophen or ibuprofen as need.   Return to ER if worse, new symptoms, severe headache, new/severe pain, or other concern.
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
## 2024-04-07 ENCOUNTER — Ambulatory Visit
Admission: EM | Admit: 2024-04-07 | Discharge: 2024-04-07 | Disposition: A | Discharge status: Home / Self Care | Payer: MEDICAID | Attending: Family Medicine | Admitting: Family Medicine

## 2024-04-07 DIAGNOSIS — N611 Abscess of the breast and nipple: Secondary | ICD-10-CM | POA: Diagnosis not present

## 2024-04-07 MED ORDER — DOXYCYCLINE HYCLATE 100 MG PO CAPS: 100.0000 mg | ORAL_CAPSULE | Freq: Two times a day (BID) | ORAL | 0 refills | Status: AC

## 2024-04-07 NOTE — Discharge Instructions (Addendum)
 Start doxycycline twice daily for 7 days.  Continue warm compresses to the area.  Please follow-up with your PCP at your scheduled appointment for further workup if your symptoms do not improve.  Please go to the emergency room for any worsening symptoms.  Hope you feel better soon!

## 2024-04-07 NOTE — ED Provider Notes (Signed)
 [facility]    CSN: [id number] Arrival date & time: 04/07/24  1349      History   Chief Complaint Chief Complaint  Patient presents with   Abscess    HPI Robin Nichols is a 28 y.o. female presents for breast abscess.  Patient reports 2 months of a possible abscess under her right breast.  States it is painful only with palpation.  Denies any drainage, swelling, fevers or chills.  Has been using over-the-counter colloid bandages and states it helps it go smaller.  No history of MRSA.  She is not breast-feeding or pregnant.  She does not have a PCP.  No other concerns at this time.   Abscess   Past Medical History:  Diagnosis Date   Anemia    Anxiety    MDD (major depressive disorder) 11/11/2021   Preventive measure 11/11/2021    Patient Active Problem List   Diagnosis Date Noted   Attention deficit hyperactivity disorder (ADHD), predominantly inattentive type 01/19/2022   Severe episode of recurrent major depressive disorder, with psychotic features (HCC) 01/19/2022   Generalized anxiety disorder 01/19/2022   MDD (major depressive disorder) 11/11/2021   Acute costochondritis 11/11/2021   Preventive measure 11/11/2021    Past Surgical History:  Procedure Laterality Date   OVARIAN CYST REMOVAL      OB History     Gravida  0   Para  0   Term  0   Preterm  0   AB  0   Living  0      SAB  0   IAB  0   Ectopic  0   Multiple  0   Live Births  0            Home Medications    Prior to Admission medications   Medication Sig Start Date End Date Taking? Authorizing Provider  doxycycline (VIBRAMYCIN) 100 MG capsule Take 1 capsule (100 mg total) by mouth 2 (two) times daily for 7 days. 04/07/24 04/14/24 Yes Raphael Espe, Jodi R, NP  atomoxetine  (STRATTERA ) 40 MG capsule Take 1 capsule (40 mg total) by mouth daily. 06/10/22   Parsons, Brittney E, NP  cholecalciferol (VITAMIN D) 1000 UNITS tablet Take 2,000 Units by mouth daily.    [provider]  escitalopram  (LEXAPRO ) 10 MG tablet Take 1 tablet (10 mg total) by mouth daily. 06/10/22   Harl Zane BRAVO, NP  hydrOXYzine  (ATARAX ) 25 MG tablet Take 1 tablet (25 mg total) by mouth 3 (three) times daily as needed. 06/10/22   Harl Zane BRAVO, NP    Family History Family History  Problem Relation Age of Onset   Hypertension Father    Vision loss Maternal Grandfather    Depression Brother    Cancer Paternal Aunt     Social History Social History   Tobacco Use   Smoking status: Never   Smokeless tobacco: Never  Vaping Use   Vaping status: Never Used  Substance Use Topics   Alcohol use: No   Drug use: No     Allergies   Patient has no known allergies.   Review of Systems Review of Systems  Skin:        Possible breast abscess     Physical Exam Triage Vital Signs ED Triage Vitals  Encounter Vitals Group     BP 04/07/24 1432 116/74     Girls Systolic BP Percentile --      Girls Diastolic BP Percentile --  Boys Systolic BP Percentile --      Boys Diastolic BP Percentile --      Pulse Rate 04/07/24 1432 82     Resp 04/07/24 1432 19     Temp 04/07/24 1432 98.9 F (37.2 C)     Temp Source 04/07/24 1432 Oral     SpO2 04/07/24 1432 98 %     Weight 04/07/24 1429 125 lb (56.7 kg)     Height 04/07/24 1429 5' 5 (1.651 m)     Head Circumference --      Peak Flow --      Pain Score 04/07/24 1429 4     Pain Loc --      Pain Education --      Exclude from Growth Chart --    No data found.  Updated Vital Signs BP 116/74 (BP Location: Right Arm)   Pulse 82   Temp 98.9 F (37.2 C) (Oral)   Resp 19   Ht 5' 5 (1.651 m)   Wt 125 lb (56.7 kg)   LMP 03/17/2024   SpO2 98%   BMI 20.80 kg/m   Visual Acuity Right Eye Distance:   Left Eye Distance:   Bilateral Distance:    Right Eye Near:   Left Eye Near:    Bilateral Near:     Physical Exam Vitals and nursing note reviewed.  Constitutional:      General: She is not in acute  distress.    Appearance: Normal appearance. She is not ill-appearing.  HENT:     Head: Normocephalic and atraumatic.  Eyes:     Pupils: Pupils are equal, round, and reactive to light.  Cardiovascular:     Rate and Rhythm: Normal rate.  Pulmonary:     Effort: Pulmonary effort is normal.  Chest:     Comments: At the 6 o'clock position on the underside of the breast there is a small area maybe a centimeter of induration without fluctuance.  Skin is slightly darker in that area but there is no erythema or warmth.  No nipple inversion.  No rashes. Skin:    General: Skin is warm and dry.  Neurological:     General: No focal deficit present.     Mental Status: She is alert and oriented to person, place, and time.  Psychiatric:        Mood and Affect: Mood normal.        Behavior: Behavior normal.      UC Treatments / Results  Labs (all labs ordered are listed, but only abnormal results are displayed) Labs Reviewed - No data to display  EKG   Radiology No results found.  Procedures Procedures (including critical care time)  Medications Ordered in UC Medications - No data to display  Initial Impression / Assessment and Plan / UC Course  I have reviewed the triage vital signs and the nursing notes.  Pertinent labs & imaging results that were available during my care of the patient were reviewed by me and considered in my medical decision making (see chart for details).     Reviewed exam and symptoms with patient.  Will treat for possible abscess with doxycycline and nursing staff was able to set her up with a PCP for further evaluation if her symptoms do not improve, i.e. ultrasound.  Warm compresses encouraged.  ER precautions reviewed and patient verbalized understanding Final Clinical Impressions(s) / UC Diagnoses   Final diagnoses:  Breast abscess     Discharge Instructions  Start doxycycline twice daily for 7 days.  Continue warm compresses to the area.   Please follow-up with your PCP at your scheduled appointment for further workup if your symptoms do not improve.  Please go to the emergency room for any worsening symptoms.  Hope you feel better soon!    ED Prescriptions     Medication Sig Dispense Auth. Provider   doxycycline (VIBRAMYCIN) 100 MG capsule Take 1 capsule (100 mg total) by mouth 2 (two) times daily for 7 days. 14 capsule Derion Kreiter, Jodi R, NP      PDMP not reviewed this encounter.   Loreda Myla SAUNDERS, NP 04/07/24 [phone]

## 2024-04-07 NOTE — ED Triage Notes (Signed)
 Pt states that she has an abscess under her right breast. X2 months

## 2024-04-26 ENCOUNTER — Encounter: Payer: Self-pay | Admitting: Family Medicine

## 2024-04-26 ENCOUNTER — Other Ambulatory Visit: Payer: Self-pay | Admitting: Family Medicine

## 2024-04-26 ENCOUNTER — Ambulatory Visit: Payer: MEDICAID | Admitting: Family Medicine

## 2024-04-26 VITALS — BP 102/68 | HR 85 | Temp 98.0°F | Ht 64.0 in | Wt 125.0 lb

## 2024-04-26 DIAGNOSIS — Z124 Encounter for screening for malignant neoplasm of cervix: Secondary | ICD-10-CM

## 2024-04-26 DIAGNOSIS — N611 Abscess of the breast and nipple: Secondary | ICD-10-CM

## 2024-04-26 DIAGNOSIS — Z13220 Encounter for screening for lipoid disorders: Secondary | ICD-10-CM

## 2024-04-26 DIAGNOSIS — R5383 Other fatigue: Secondary | ICD-10-CM

## 2024-04-26 DIAGNOSIS — F322 Major depressive disorder, single episode, severe without psychotic features: Secondary | ICD-10-CM | POA: Diagnosis not present

## 2024-04-26 DIAGNOSIS — Z862 Personal history of diseases of the blood and blood-forming organs and certain disorders involving the immune mechanism: Secondary | ICD-10-CM

## 2024-04-26 DIAGNOSIS — Z136 Encounter for screening for cardiovascular disorders: Secondary | ICD-10-CM

## 2024-04-26 DIAGNOSIS — Z1159 Encounter for screening for other viral diseases: Secondary | ICD-10-CM

## 2024-04-26 DIAGNOSIS — E559 Vitamin D deficiency, unspecified: Secondary | ICD-10-CM | POA: Diagnosis not present

## 2024-04-26 DIAGNOSIS — Z7689 Persons encountering health services in other specified circumstances: Secondary | ICD-10-CM

## 2024-04-26 LAB — CBC WITH DIFFERENTIAL/PLATELET
Basophils Absolute: 0.1 K/uL (ref 0.0–0.1)
Basophils Relative: 0.8 % (ref 0.0–3.0)
Eosinophils Absolute: 0.2 K/uL (ref 0.0–0.7)
Eosinophils Relative: 2.3 % (ref 0.0–5.0)
HCT: 42.2 % (ref 36.0–46.0)
Hemoglobin: 14 g/dL (ref 12.0–15.0)
Lymphocytes Relative: 30.2 % (ref 12.0–46.0)
Lymphs Abs: 2.1 K/uL (ref 0.7–4.0)
MCHC: 33.3 g/dL (ref 30.0–36.0)
MCV: 93.1 fl (ref 78.0–100.0)
Monocytes Absolute: 0.6 K/uL (ref 0.1–1.0)
Monocytes Relative: 8.6 % (ref 3.0–12.0)
Neutro Abs: 4.1 K/uL (ref 1.4–7.7)
Neutrophils Relative %: 58.1 % (ref 43.0–77.0)
Platelets: 298 K/uL (ref 150.0–400.0)
RBC: 4.53 Mil/uL (ref 3.87–5.11)
RDW: 12.7 % (ref 11.5–15.5)
WBC: 7 K/uL (ref 4.0–10.5)

## 2024-04-26 LAB — LIPID PANEL
Cholesterol: 154 mg/dL (ref 0–200)
HDL: 62.4 mg/dL (ref >39.00)
LDL Cholesterol: 79 mg/dL (ref 0–99)
NonHDL: 91.77
Total CHOL/HDL Ratio: 2
Triglycerides: 63 mg/dL (ref 0.0–149.0)
VLDL: 12.6 mg/dL (ref 0.0–40.0)

## 2024-04-26 LAB — COMPREHENSIVE METABOLIC PANEL WITH GFR
ALT: 12 U/L (ref 0–35)
AST: 16 U/L (ref 0–37)
Albumin: 4.3 g/dL (ref 3.5–5.2)
Alkaline Phosphatase: 53 U/L (ref 39–117)
BUN: 10 mg/dL (ref 6–23)
CO2: 27 meq/L (ref 19–32)
Calcium: 9.2 mg/dL (ref 8.4–10.5)
Chloride: 103 meq/L (ref 96–112)
Creatinine, Ser: 0.64 mg/dL (ref 0.40–1.20)
GFR: 122.06 mL/min (ref >60.00)
Glucose, Bld: 84 mg/dL (ref 70–99)
Potassium: 4.1 meq/L (ref 3.5–5.1)
Sodium: 137 meq/L (ref 135–145)
Total Bilirubin: 0.5 mg/dL (ref 0.2–1.2)
Total Protein: 7.9 g/dL (ref 6.0–8.3)

## 2024-04-26 LAB — VITAMIN B12: Vitamin B-12: 388 pg/mL (ref 211–911)

## 2024-04-26 LAB — TSH: TSH: 5.35 u[IU]/mL (ref 0.35–5.50)

## 2024-04-26 LAB — VITAMIN D 25 HYDROXY (VIT D DEFICIENCY, FRACTURES): VITD: 17.9 ng/mL — ABNORMAL LOW (ref 30.00–100.00)

## 2024-04-26 NOTE — Patient Instructions (Addendum)
-  It was nice to meet you and look forward to taking care of you. -Ordered labs. Office will call with lab results and will be available via MyChart. -Placed a referral to GYN and psychology (therapy). Please call the office or send a MyChart message if you do not receive a phone call or a MyChart message about appointment in 2 weeks.  -Ordered ultrasound of right breast. Please call the office or send a MyChart message if you do not receive a phone call or a MyChart message about appointment in 2 weeks.  -Follow up in 1 year for a physical.

## 2024-04-26 NOTE — Progress Notes (Signed)
 New Patient Office Visit  Subjective   Patient ID: Robin Nichols, female    DOB: June 09, 1995  Age: 28 y.o. MRN: [id number]  CC:  Chief Complaint  Patient presents with   Establish Care    HPI Robin Nichols presents to establish care with new provider.   Patients previous primary care: None   Specialist: None   Patient was seen at [facility] on 10/31 for right breast abscess. At that visit she reported the abscess had been present for 2 months. Only painful with palpation. Denied any drainage, swelling, fever, chills. Has been using OTC colloid bandages and reports it helps to get smaller. Denied breast feeding or pregnant. She was prescribed Doxycycline for possible abscess.   She reports she took antibiotic and thinks the abscess has resolved. However, she would like imaging to confirm. She has a family history of breast cancer with parental aunt.   Also, requesting a referral to counseling. Based on chart review, she last seen behavioral health with Paige Cozart, LCSW in early 2024 for counseling and Laymon Mouse, NP for medication management.   She would like labs collected. She reports she previously was anemic and had a vitamin D deficiency. She is complaining of fatigue.   Outpatient Encounter Medications as of 04/26/2024  Medication Sig   [DISCONTINUED] atomoxetine  (STRATTERA ) 40 MG capsule Take 1 capsule (40 mg total) by mouth daily.   [DISCONTINUED] cholecalciferol (VITAMIN D) 1000 UNITS tablet Take 2,000 Units by mouth daily.   [DISCONTINUED] escitalopram  (LEXAPRO ) 10 MG tablet Take 1 tablet (10 mg total) by mouth daily.   [DISCONTINUED] hydrOXYzine  (ATARAX ) 25 MG tablet Take 1 tablet (25 mg total) by mouth 3 (three) times daily as needed.   No facility-administered encounter medications on file as of 04/26/2024.    Past Medical History:  Diagnosis Date   Anemia    Anxiety    MDD (major depressive disorder) 11/11/2021   Preventive  measure 11/11/2021   Vitamin D deficiency     Past Surgical History:  Procedure Laterality Date   OVARIAN CYST REMOVAL      Family History  Problem Relation Age of Onset   Hypertension Father    Depression Brother    Cancer Paternal Aunt        Breast   Vision loss Maternal Grandfather    Alzheimer's disease Maternal Grandfather     Social History   Socioeconomic History   Marital status: Single    Spouse name: Not on file   Number of children: 0   Years of education: Not on file   Highest education level: Bachelor's degree (e.g., BA, AB, BS)  Occupational History   Occupation: Personnel Officer  Tobacco Use   Smoking status: Never   Smokeless tobacco: Never  Vaping Use   Vaping status: Never Used  Substance and Sexual Activity   Alcohol use: No   Drug use: No   Sexual activity: Never  Other Topics Concern   Not on file  Social History Narrative   Not on file   Social Drivers of Health   Financial Resource Strain: Low Risk  (04/26/2024)   Overall Financial Resource Strain (CARDIA)    Difficulty of Paying Living Expenses: Not hard at all  Food Insecurity: No Food Insecurity (04/26/2024)   Hunger Vital Sign    Worried About Running Out of Food in the Last Year: Never true    Ran Out of Food in the Last Year: Never  true  Transportation Needs: No Transportation Needs (04/26/2024)   PRAPARE - Administrator, Civil Service (Medical): No    Lack of Transportation (Non-Medical): No  Physical Activity: Sufficiently Active (04/26/2024)   Exercise Vital Sign    Days of Exercise per Week: 5 days    Minutes of Exercise per Session: 30 min  Stress: No Stress Concern Present (04/26/2024)   [employer] of Occupational Health - Occupational Stress Questionnaire    Feeling of Stress: Only a little  Social Connections: Moderately Isolated (04/26/2024)   Social Connection and Isolation Panel    Frequency of Communication with Friends and Family: More than  three times a week    Frequency of Social Gatherings with Friends and Family: Twice a week    Attends Religious Services: More than 4 times per year    Active Member of [employer] or Organizations: No    Attends Banker Meetings: Never    Marital Status: Never married  Intimate Partner Violence: Not At Risk (04/26/2024)   Humiliation, Afraid, Rape, and Kick questionnaire    Fear of Current or Ex-Partner: No    Emotionally Abused: No    Physically Abused: No    Sexually Abused: No    ROS See HPI above    Objective  BP 102/68   Pulse 85   Temp 98 F (36.7 C) (Oral)   Ht 5' 4 (1.626 m)   Wt 125 lb (56.7 kg)   LMP 03/17/2024   SpO2 97%   BMI 21.46 kg/m   Physical Exam Vitals reviewed. Exam conducted with a chaperone present.  Constitutional:      General: She is not in acute distress.    Appearance: Normal appearance. She is not ill-appearing, toxic-appearing or diaphoretic.  Eyes:     General:        Right eye: No discharge.        Left eye: No discharge.     Conjunctiva/sclera: Conjunctivae normal.  Cardiovascular:     Rate and Rhythm: Normal rate and regular rhythm.     Heart sounds: Normal heart sounds. No murmur heard.    No friction rub. No gallop.  Pulmonary:     Effort: Pulmonary effort is normal. No respiratory distress.     Breath sounds: Normal breath sounds.  Chest:  Breasts:    Right: Skin change present. No swelling, inverted nipple, nipple discharge or tenderness.     Comments: No palpable mass, slight skin change noted at 6 o'clock below nipple.  Musculoskeletal:        General: Normal range of motion.  Skin:    General: Skin is warm and dry.  Neurological:     General: No focal deficit present.     Mental Status: She is alert and oriented to person, place, and time. Mental status is at baseline.  Psychiatric:        Mood and Affect: Mood normal.        Behavior: Behavior normal.        Thought Content: Thought content normal.         Judgment: Judgment normal.      Assessment & Plan:  Encounter to establish care  Cervical cancer screening -     Ambulatory referral to Gynecology  Current severe episode of major depressive disorder without psychotic features, unspecified whether recurrent (HCC) -     Ambulatory referral to Psychology  Fatigue, unspecified type -     CBC with Differential/Platelet -  Comprehensive metabolic panel with GFR -     TSH -     Vitamin B12 -     VITAMIN D 25 Hydroxy (Vit-D Deficiency, Fractures)  Vitamin D deficiency -     VITAMIN D 25 Hydroxy (Vit-D Deficiency, Fractures)  Need for hepatitis C screening test -     Hepatitis C antibody  Encounter for lipid screening for cardiovascular disease -     Lipid panel  History of anemia -     CBC with Differential/Platelet -     Iron, TIBC and Ferritin Panel  Abscess of right breast -     US  LIMITED ULTRASOUND INCLUDING AXILLA RIGHT BREAST; Future  1.Review health maintenance:  -Cervical cancer screening: Placed a referral to GYN -Covid booster: Declines  -Tdap vaccine: Declines  -Hep C screening: Order   -HPV vaccine: Unsure  -Influenza vaccine: Declines  2.Ordered labs for a reason of fatigue with a history anemia and vitamin D deficiency and screening. Office will call with lab results and will be available via MyChart. 3.Placed a referral psychology for depression.  4.Ordered ultrasound of right breast of abscess of right breast.   Return in about 1 year (around 04/26/2025) for physical.   Gianlucas Evenson, NP

## 2024-04-27 ENCOUNTER — Ambulatory Visit: Payer: Self-pay | Admitting: Family Medicine

## 2024-04-27 DIAGNOSIS — E559 Vitamin D deficiency, unspecified: Secondary | ICD-10-CM

## 2024-04-27 LAB — IRON,TIBC AND FERRITIN PANEL
%SAT: 20 % (ref 16–45)
Ferritin: 16 ng/mL (ref 16–154)
Iron: 72 ug/dL (ref 40–190)
TIBC: 355 ug/dL (ref 250–450)

## 2024-04-27 LAB — HEPATITIS C ANTIBODY: Hepatitis C Ab: NONREACTIVE

## 2024-04-27 MED ORDER — VITAMIN D (ERGOCALCIFEROL) 1.25 MG (50000 UNIT) PO CAPS: 50000.0000 [IU] | ORAL_CAPSULE | ORAL | 0 refills | Status: AC

## 2024-05-08 ENCOUNTER — Ambulatory Visit
Admission: RE | Admit: 2024-05-08 | Discharge: 2024-05-08 | Disposition: A | Payer: MEDICAID | Source: Ambulatory Visit | Attending: Family Medicine | Admitting: Family Medicine

## 2024-05-08 DIAGNOSIS — N611 Abscess of the breast and nipple: Secondary | ICD-10-CM

## 2024-05-30 ENCOUNTER — Encounter: Payer: Self-pay | Admitting: Behavioral Health

## 2024-05-30 ENCOUNTER — Ambulatory Visit (INDEPENDENT_AMBULATORY_CARE_PROVIDER_SITE_OTHER): Payer: MEDICAID | Admitting: Behavioral Health

## 2024-05-30 DIAGNOSIS — F332 Major depressive disorder, recurrent severe without psychotic features: Secondary | ICD-10-CM | POA: Diagnosis not present
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
## 2024-05-30 NOTE — Progress Notes (Signed)
   Robin Nichols, Robin Nichols

## 2024-06-23 ENCOUNTER — Encounter: Payer: Self-pay | Admitting: Behavioral Health

## 2024-06-23 ENCOUNTER — Ambulatory Visit: Payer: MEDICAID | Admitting: Behavioral Health

## 2024-06-23 DIAGNOSIS — F332 Major depressive disorder, recurrent severe without psychotic features: Secondary | ICD-10-CM
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
## 2024-06-23 NOTE — Progress Notes (Signed)
   Robin Nichols, [facility]

## 2024-06-30 ENCOUNTER — Ambulatory Visit: Payer: MEDICAID | Admitting: Behavioral Health

## 2024-06-30 ENCOUNTER — Encounter: Payer: Self-pay | Admitting: Behavioral Health

## 2024-06-30 DIAGNOSIS — F332 Major depressive disorder, recurrent severe without psychotic features: Secondary | ICD-10-CM | POA: Diagnosis not present

## 2024-06-30 DIAGNOSIS — F900 Attention-deficit hyperactivity disorder, predominantly inattentive type: Secondary | ICD-10-CM

## 2024-06-30 DIAGNOSIS — F411 Generalized anxiety disorder: Secondary | ICD-10-CM
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
## 2024-07-05 ENCOUNTER — Ambulatory Visit: Payer: MEDICAID | Admitting: Behavioral Health

## 2024-07-11 ENCOUNTER — Encounter: Payer: Self-pay | Admitting: Behavioral Health

## 2024-07-11 ENCOUNTER — Ambulatory Visit: Payer: MEDICAID | Admitting: Behavioral Health

## 2024-07-11 DIAGNOSIS — F411 Generalized anxiety disorder: Secondary | ICD-10-CM | POA: Diagnosis not present

## 2024-07-11 DIAGNOSIS — F332 Major depressive disorder, recurrent severe without psychotic features: Secondary | ICD-10-CM | POA: Diagnosis not present

## 2024-07-11 DIAGNOSIS — F900 Attention-deficit hyperactivity disorder, predominantly inattentive type: Secondary | ICD-10-CM

## 2024-07-12 ENCOUNTER — Encounter: Payer: Self-pay | Admitting: Behavioral Health

## 2024-07-12 ENCOUNTER — Ambulatory Visit: Payer: MEDICAID | Admitting: Behavioral Health

## 2024-07-12 DIAGNOSIS — F332 Major depressive disorder, recurrent severe without psychotic features: Secondary | ICD-10-CM | POA: Diagnosis not present

## 2024-07-12 DIAGNOSIS — F900 Attention-deficit hyperactivity disorder, predominantly inattentive type: Secondary | ICD-10-CM

## 2024-07-12 DIAGNOSIS — F411 Generalized anxiety disorder: Secondary | ICD-10-CM
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
## 2024-07-20 ENCOUNTER — Ambulatory Visit: Payer: MEDICAID | Admitting: Behavioral Health

## 2024-07-26 ENCOUNTER — Ambulatory Visit: Payer: MEDICAID | Admitting: Behavioral Health
